# Patient Record
Sex: Male | Born: 1975 | Race: Black or African American | Hispanic: No | Marital: Single | Smoking: Never smoker
Health system: Southern US, Community
[De-identification: ages and names within clinical notes are randomized; demographics above are authoritative.]

---

## 2011-06-22 ENCOUNTER — Encounter (HOSPITAL_COMMUNITY): Payer: Self-pay | Admitting: Nurse Practitioner

## 2011-06-22 ENCOUNTER — Emergency Department (HOSPITAL_COMMUNITY)
Admission: EM | Admit: 2011-06-22 | Discharge: 2011-06-22 | Disposition: A | Discharge status: Home / Self Care | Payer: Self-pay | Attending: Emergency Medicine | Admitting: Emergency Medicine

## 2011-06-22 ENCOUNTER — Emergency Department (HOSPITAL_COMMUNITY): Payer: Self-pay

## 2011-06-22 DIAGNOSIS — R011 Cardiac murmur, unspecified: Secondary | ICD-10-CM | POA: Insufficient documentation

## 2011-06-22 DIAGNOSIS — R599 Enlarged lymph nodes, unspecified: Secondary | ICD-10-CM | POA: Insufficient documentation

## 2011-06-22 DIAGNOSIS — J36 Peritonsillar abscess: Secondary | ICD-10-CM | POA: Insufficient documentation

## 2011-06-22 LAB — DIFFERENTIAL
Basophils Absolute: 0.2 10*3/uL — ABNORMAL HIGH (ref 0.0–0.1)
Lymphocytes Relative: 11 % — ABNORMAL LOW (ref 12–46)
Monocytes Relative: 7 % (ref 3–12)
Neutro Abs: 13.6 10*3/uL — ABNORMAL HIGH (ref 1.7–7.7)
Neutrophils Relative %: 81 % — ABNORMAL HIGH (ref 43–77)

## 2011-06-22 LAB — BASIC METABOLIC PANEL
BUN: 16 mg/dL (ref 6–23)
Chloride: 99 mEq/L (ref 96–112)
GFR calc Af Amer: >90 mL/min (ref >90)
Potassium: 4.3 mEq/L (ref 3.5–5.1)
Sodium: 138 mEq/L (ref 135–145)

## 2011-06-22 LAB — CBC
HCT: 42 % (ref 39.0–52.0)
RDW: 12.3 % (ref 11.5–15.5)
WBC: 16.8 10*3/uL — ABNORMAL HIGH (ref 4.0–10.5)

## 2011-06-22 MED ORDER — CHLORHEXIDINE GLUCONATE 0.12 % MT SOLN: 15.0000 mL | Freq: Two times a day (BID) | OROMUCOSAL | Status: AC

## 2011-06-22 MED ORDER — MORPHINE SULFATE 4 MG/ML IJ SOLN
4.0000 mg | Freq: Once | INTRAMUSCULAR | Status: AC
  Administered 2011-06-22: 4 mg via INTRAVENOUS
  Filled 2011-06-22: qty 1

## 2011-06-22 MED ORDER — OXYCODONE-ACETAMINOPHEN 5-325 MG PO TABS: 1.0000 | ORAL_TABLET | Freq: Four times a day (QID) | ORAL | Status: AC | PRN

## 2011-06-22 MED ORDER — SODIUM CHLORIDE 0.9 % IV SOLN
3.0000 g | Freq: Once | INTRAVENOUS | Status: AC
  Administered 2011-06-22: 3 g via INTRAVENOUS
  Filled 2011-06-22: qty 3

## 2011-06-22 MED ORDER — MORPHINE SULFATE 4 MG/ML IJ SOLN
6.0000 mg | Freq: Once | INTRAMUSCULAR | Status: AC
  Administered 2011-06-22: 6 mg via INTRAVENOUS
  Filled 2011-06-22: qty 2

## 2011-06-22 MED ORDER — LIDOCAINE VISCOUS 2 % MT SOLN: 20.0000 mL | OROMUCOSAL | Status: AC | PRN

## 2011-06-22 MED ORDER — DEXAMETHASONE SODIUM PHOSPHATE 10 MG/ML IJ SOLN
10.0000 mg | Freq: Once | INTRAMUSCULAR | Status: AC
  Administered 2011-06-22: 10 mg via INTRAVENOUS
  Filled 2011-06-22: qty 1

## 2011-06-22 MED ORDER — AMOXICILLIN-POT CLAVULANATE 875-125 MG PO TABS: 1.0000 | ORAL_TABLET | Freq: Two times a day (BID) | ORAL | Status: AC

## 2011-06-22 MED ORDER — SODIUM CHLORIDE 0.9 % IV SOLN
Freq: Once | INTRAVENOUS | Status: AC
  Administered 2011-06-22: 15:00:00 via INTRAVENOUS

## 2011-06-22 MED ORDER — PREDNISONE 20 MG PO TABS: 40.0000 mg | ORAL_TABLET | Freq: Every day | ORAL | Status: AC

## 2011-06-22 MED ORDER — IOHEXOL 300 MG/ML  SOLN
80.0000 mL | Freq: Once | INTRAMUSCULAR | Status: AC | PRN
  Administered 2011-06-22: 80 mL via INTRAVENOUS

## 2011-06-22 NOTE — ED Provider Notes (Signed)
History     CSN: [id number]  Arrival date & time 06/22/11  1201   First MD Initiated Contact with Patient 06/22/11 1407      Chief Complaint  Patient presents with  . Lymphadenopathy    (Consider location/radiation/quality/duration/timing/severity/associated sxs/prior treatment) The history is provided by the patient.  35yo otherwise healthy patient presents with 5 days of sore throat, cough, congestion and "gland swelling" to the left side of his neck. He states that the swelling has progressed over the past several days. He is able to drink but states it has become gradually more difficult to do so. States he often finds himself having to spit out his saliva as he is unable to swallow. He has been unable to eat for several days. Denies difficulty breathing. Denies fever, chills, nausea, vomiting, diarrhea. He has had no chest pain, shortness of breath, abdominal pain. No recent weight loss. Denies recent dental problems.  History reviewed. No pertinent past medical history.  History reviewed. No pertinent past surgical history.  History reviewed. No pertinent family history.  History  Substance Use Topics  . Smoking status: Never Smoker   . Smokeless tobacco: Not on file  . Alcohol Use: No      Review of Systems  Constitutional: Positive for appetite change. Negative for fever, chills, diaphoresis, activity change and unexpected weight change.  HENT: Positive for congestion, sore throat, facial swelling, rhinorrhea, trouble swallowing and neck pain. Negative for ear pain, drooling, neck stiffness, dental problem, sinus pressure and tinnitus.   Eyes: Negative.   Respiratory: Positive for cough. Negative for chest tightness, shortness of breath and wheezing.   Cardiovascular: Negative for chest pain and palpitations.  Gastrointestinal: Negative for nausea, vomiting, abdominal pain and diarrhea.  Skin: Negative for color change and rash.  Neurological: Negative for headaches.      Allergies  Review of patient's allergies indicates no known allergies.  Home Medications   Current Outpatient Rx  Name Route Sig Dispense Refill  . PHENOL 1.4 % MT LIQD Mouth/Throat Use as directed 1 spray in the mouth or throat as needed. For swollen gland.      BP 138/89  Pulse 87  Temp(Src) 98.5 F (36.9 C) (Oral)  Resp 18  Ht 6' (1.829 m)  Wt 170 lb (77.111 kg)  BMI 23.06 kg/m2  SpO2 99%  Physical Exam  Nursing note and vitals reviewed. Constitutional: He appears well-developed and well-nourished. No distress.  HENT:  Head: Normocephalic and atraumatic. There is trismus in the jaw.  Right Ear: Tympanic membrane and external ear normal.  Left Ear: Tympanic membrane and external ear normal.       Trismus; pt only able to open mouth about 2-3 cm. Unable to fully visualize posterior oropharynx. Mucus membranes moist.  Eyes: EOM are normal. Pupils are equal, round, and reactive to light.  Neck: Trachea normal. Neck supple. No spinous process tenderness and no muscular tenderness present. No rigidity. No tracheal deviation present. No Brudzinski's sign and no Kernig's sign noted. No thyromegaly present.       Moderate submandibular swelling L. Tenderness. No overlying erythema.  Cardiovascular: Normal rate, regular rhythm and normal heart sounds.   Pulmonary/Chest: Effort normal and breath sounds normal. No stridor. He has no wheezes. He exhibits no tenderness.  Abdominal: Soft. There is no tenderness.  Neurological: He is alert. No cranial nerve deficit.  Skin: Skin is warm and dry. He is not diaphoretic.  Psychiatric: He has a normal mood and affect.  ED Course  Procedures (including critical care time)   Labs Reviewed  CBC  DIFFERENTIAL  BASIC METABOLIC PANEL   No results found.   No diagnosis found.    MDM  2:00 PM Patient seen and assessed. He has had sore throat, cough and congestion for the past 5 days which has been accompanied by gradual  submandibular left-sided swelling. On exam, he is unable to open his mouth more than about 2 cm. There is swelling and tenderness noted to the left submandibular area, but this does not appear extensive. Cannot fully visualize post oropharynx. Plan to obtain CT of the soft tissue of the neck to rule out abscess. Will obtain basic labs and give Decadron. Discussed with Dr. Jeraldine Loots.  3:22 PM Signout given to Jay, PA-C who will disposition as appropriate. CT, labs pending.    Jacub Waiters, [place] 06/22/11 1537

## 2011-06-22 NOTE — ED Notes (Signed)
Patient transported to CT 

## 2011-06-22 NOTE — ED Notes (Signed)
C./o "glands swelling" to L side of face over past several days. States hard to eat/drink/swallow due to swelling but denies trouble breathing

## 2011-06-22 NOTE — Discharge Instructions (Signed)
Please read and follow all provided instructions.  Your diagnoses today include:  1. Tonsillar abscess     Tests performed today include:  Blood counts  CT scan of your neck that shows an abscess of your tonsil  Vital signs. See below for your results today.   Medications prescribed:   Percocet (oxycodone/acetaminophen) - narcotic pain medication  You have been prescribed narcotic pain medication such as Vicodin or Percocet: DO NOT drive or perform any activities that require you to be awake and alert because this medicine can make you drowsy. Do not take any other medications that contain tylenol while taking this medication because you might take too much.    Augmentin - antibiotic  You have been prescribed an antibiotic medicine: take the entire course of medicine even if you are feeling better. Stopping early can cause the antibiotic not to work.   Peridex - to rinse mouth  Viscous lidocaine - to numb mouth  Take any prescribed medications only as directed.  Home care instructions:  Follow any educational materials contained in this packet.  Follow-up instructions: Please follow-up with the ear, nose, and throat doctor in the next 2 days for further evaluation of your symptoms. Call and tell them you were in the Emergency Department and diagnosed with a tonsillar abscess.    If you do not have a primary care doctor -- see below for referral information.   Return instructions:   Please return to the Emergency Department if you experience worsening symptoms, worsening swelling, persistent fever, or have trouble breathing.   Please return if you have any other emergent concerns.  Additional Information:  Your vital signs today were: BP 138/81  Pulse 82  Temp(Src) 98.5 F (36.9 C) (Oral)  Resp 16  Ht 6' (1.829 m)  Wt 170 lb (77.111 kg)  BMI 23.06 kg/m2  SpO2 97% If your blood pressure (BP) was elevated above 135/85 this visit, please have this repeated by  your doctor within one month. -------------- No Primary Care Doctor Call [facility]  [phone] Other agencies that provide inexpensive medical care    [facility]  [phone]    [facility]  [phone]    [facility]  [phone]    [facility]  [phone]    [facility]  [phone]    [facility]  [phone] -------------- RESOURCE GUIDE:  Dental Problems  Patients with Medicaid: [facility] Dental [phone]                                            [phone] [address] [employer] [address] Phone:  [phone]                                                   Phone:  [phone]  If unable to pay or uninsured, contact:  [facility] or [facility]. to become qualified for the adult dental clinic.  Chronic Pain Problems Contact [facility]  [phone] Patients need to be referred by their primary care doctor.  Insufficient Money for Medicine  Contact [employer]:  call "211" or [facility] [employer] [phone].  Psychological [facility]  [phone] [facility]  [phone] [facility]   [phone] (emergency services [phone])  Substance Abuse Resources [facility]  [phone] [facility] [phone] [facility] [phone] Floydene Flock [phone] Residential & Outpatient Substance Abuse Program  [phone]  Abuse/Neglect [facility] Child Abuse Hotline [phone] [facility] Child Abuse Hotline [phone] (After Hours)  Emergency Shelter [facility] [phone]  Maternity Homes Room at the [facility] [phone] [facility] [phone]  [facility] Resources  [facility]     [employer]                          [facility] [address] [place]                        [address]      [address]  Blondell Reveal Phone:  [phone]                                   Phone:  [phone]                 Phone:  [phone]  [facility] Phone:  [phone]  [facility] Child Abuse Hotline [phone] [phone] (After Hours)

## 2011-06-22 NOTE — ED Provider Notes (Signed)
Medical screening examination/treatment/procedure(s) were conducted as a shared visit with non-physician practitioner(s) and myself.  I personally evaluated the patient during the encounter On my exam the patient is in no distress, though he has a notable left peritonsillar enhancement. I reviewed the CT scan.  I performed an aspiration attempt with moderate success.  Following the aspiration, which was tolerated well, the patient's lesion was less pronounced, and there was a visible screening uvula in the left side of the posterior oropharynx, which was not present prior to the aspiration.  The patient was discharged with antibiotics, ENT followup in 2 days.  PROCEDURE NOTE Patient prepped in usual fashion Verbal consent provided following discussion of R/B and alternatives Patient prepped with topical spray, then 3ml of 2%lido injected into the lesion (after visualization of ct for localization) Three probes with a (depth limited) 18g needle were performed with withdrawal of serosanguinous fluid and decrease in size of abscess.  Procedure was well tolerated.  Gerhard Munch, MD 06/22/11 1946

## 2011-06-22 NOTE — ED Provider Notes (Signed)
History     CSN: [id number]  Arrival date & time 06/22/11  1201   First MD Initiated Contact with Patient 06/22/11 1407      Chief Complaint  Patient presents with  . Lymphadenopathy    (Consider location/radiation/quality/duration/timing/severity/associated sxs/prior treatment) HPI  History reviewed. No pertinent past medical history.  History reviewed. No pertinent past surgical history.  History reviewed. No pertinent family history.  History  Substance Use Topics  . Smoking status: Never Smoker   . Smokeless tobacco: Not on file  . Alcohol Use: No      Review of Systems  Allergies  Review of patient's allergies indicates no known allergies.  Home Medications   Current Outpatient Rx  Name Route Sig Dispense Refill  . PHENOL 1.4 % MT LIQD Mouth/Throat Use as directed 1 spray in the mouth or throat as needed. For swollen gland.      BP 138/89  Pulse 87  Temp(Src) 98.5 F (36.9 C) (Oral)  Resp 18  Ht 6' (1.829 m)  Wt 170 lb (77.111 kg)  BMI 23.06 kg/m2  SpO2 99%  Physical Exam  ED Course  Procedures (including critical care time)  Labs Reviewed  CBC - Abnormal; Notable for the following:    WBC 16.8 (*)    MCV 77.6 (*)    MCHC 36.2 (*)    All other components within normal limits  DIFFERENTIAL - Abnormal; Notable for the following:    Neutrophils Relative 81 (*)    Lymphocytes Relative 11 (*)    Neutro Abs 13.6 (*)    Monocytes Absolute 1.2 (*)    Basophils Absolute 0.2 (*)    All other components within normal limits  BASIC METABOLIC PANEL - Abnormal; Notable for the following:    GFR calc non Af Amer 87 (*)    All other components within normal limits   Ct Soft Tissue Neck W Contrast  06/22/2011  *RADIOLOGY REPORT*  Clinical Data: Left-sided facial pain and swelling.  CT NECK WITH CONTRAST  Technique:  Multidetector CT imaging of the neck was performed with intravenous contrast.  Contrast: 80mL OMNIPAQUE IOHEXOL 300 MG/ML IJ SOLN   Comparison: None.  Findings: The visualized portion of the brain appears normal.  The major visualized intravascular structures are patent.  The skull base is unremarkable.  The visualized paranasal sinuses and mastoid air cells are clear except for scattered left ethmoid mucoperiosteal thickening and a mucous retention cyst or polyp in the left maxillary sinus.  There is a large tonsillar abscess on the left side measuring approximately 3 cm.  There is narrowing of the posterior nasopharynx.  The epiglottis and aryepiglottic folds are normal. The piriform sinus and vallecular air spaces are normal.  No prevertebral or retropharyngeal extension.  The parotid and submandibular glands are normal.  There are enlarged submandibular and jugulodigastric lymph nodes bilaterally.  The thyroid gland appears normal.  The major vascular structures are patent.  No supraclavicular or superior mediastinal lymphadenopathy.  The lung apices are clear.  IMPRESSION: 3 cm left tonsillar abscess.  Original Report Authenticated By: P. Loralie Champagne, M.D.     1. Tonsillar abscess     3:35 PM patient to CDU pending CT scan of neck. Patient presents with several days of trismus and sore throat. Denies fever. Patient has been given pain medication IV in emergency department.  Patient seen and examined. He notes that his throat swelling is improved after receiving steroids. He says he is talking more easily now.  On my exam the patient has a large left-sided peritonsillar abscess. Antibiotics ordered. CT is pending. Suspect patient will need ENT consult for drainage.  3:37 PM Exam: Gen NAD; Mouth fullness to the left posterior oropharynx with uvula deviated to the right; Heart RRR, nml S1,S2, no m/r/g; Lungs CTAB, no respiratory distress; Abd soft, NT, no rebound or guarding; Ext 2+ pedal pulses bilaterally, no edema.  5:41 PM CT reviewed by myself and Dr. Jeraldine Loots. Drainage of peritonsillar abscess attempted by Dr. Jeraldine Loots  with return of small amount of pus. Will discharge home on antibiotics, pain medicine. Patient will need to see ENT on Monday for followup. Referral given.  Patient counseled to return with worsening symptoms, trouble breathing, worsening throat swelling, persistent fever, or other concerns.  MDM  Tonsillar abscess. Drainage attempted in the ED. Antibiotics given by IV in ED. ENT referral given. Patient is not having any airway compromise. He appears well and is stable at time of discharge home.       Renne Crigler, [place] 06/22/11 1952

## 2011-06-23 NOTE — ED Provider Notes (Signed)
Medical screening examination/treatment/procedure(s) were performed by non-physician practitioner and as supervising physician I was immediately available for consultation/collaboration.  Gerhard Munch, MD 06/23/11 [phone]

## 2013-07-19 IMAGING — CT CT NECK W/ CM
3 of 5 series · 8 of 20 positions shown, 9 images · IV contrast (75ml omni 300)
Comparison: None.

CLINICAL DATA: Left-sided facial pain and swelling.

CT NECK WITH CONTRAST
TECHNIQUE: Multidetector CT imaging of the neck was performed with
intravenous contrast.
Contrast: 80mL OMNIPAQUE IOHEXOL 300 MG/ML IJ SOLN

[Series 2: 2cc/30ml and 1cc/45ml · axial · 0.47mm/px · z∈[-230,-106]mm · 3 of 100 slices shown]
[im 25/100  bone]
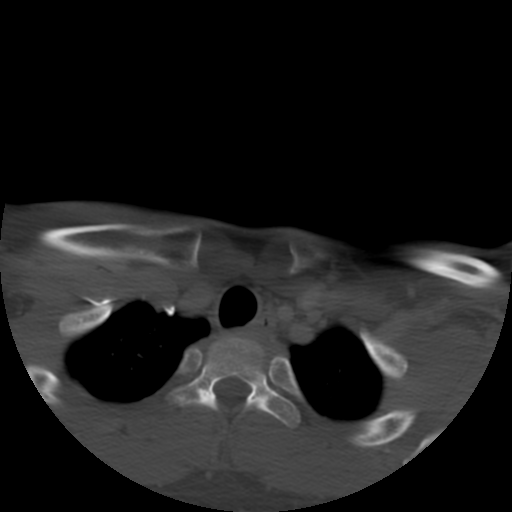
[im 50/100  bone]
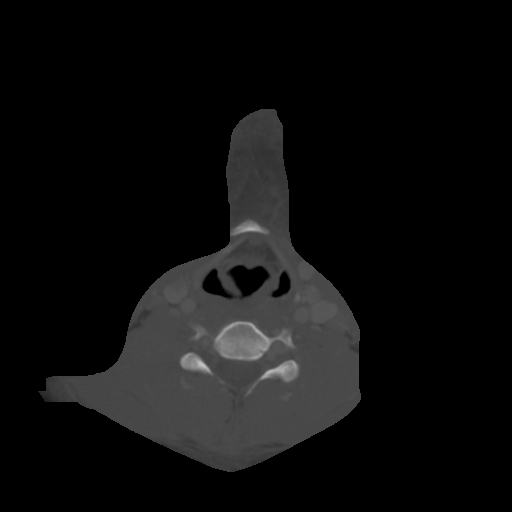
[im 75/100  bone]
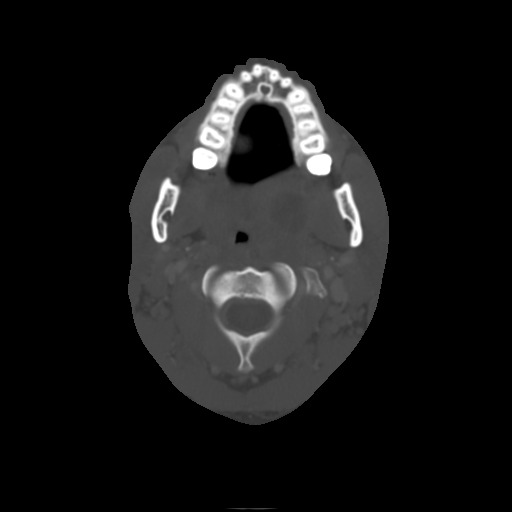

[Series 300: orthogonals · axial · 0.39mm/px · z∈[-233,-156]mm · 2 of 83 slices shown, 3 images]
[im 28/83  soft-tissue]
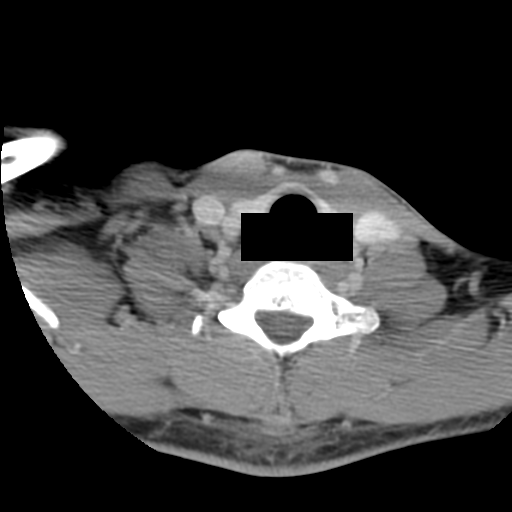
[im 28/83  bone]
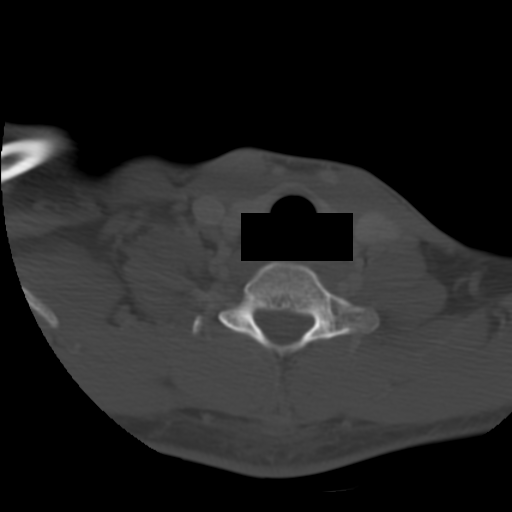
[im 55/83  bone]
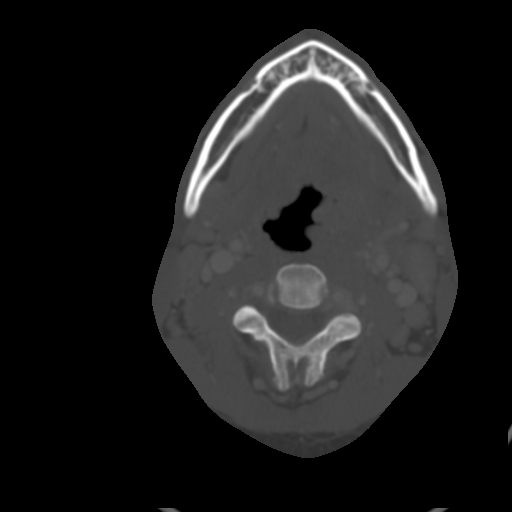

[Series 301: cor · coronal · 0.39mm/px · 3 of 68 slices shown]
[im 14/68  bone]
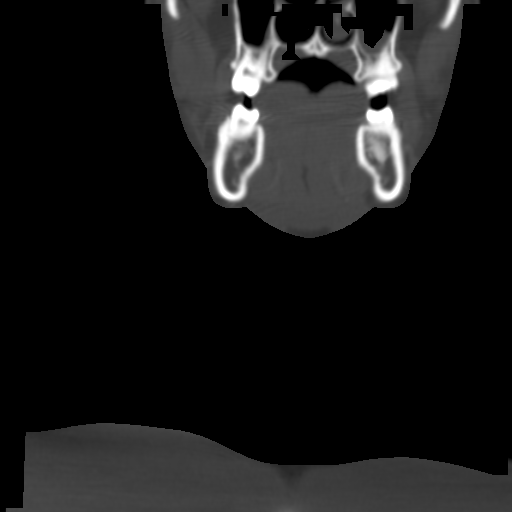
[im 27/68  bone]
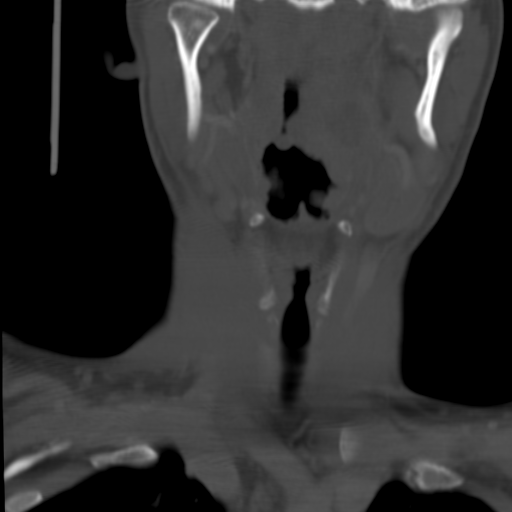
[im 41/68  bone]
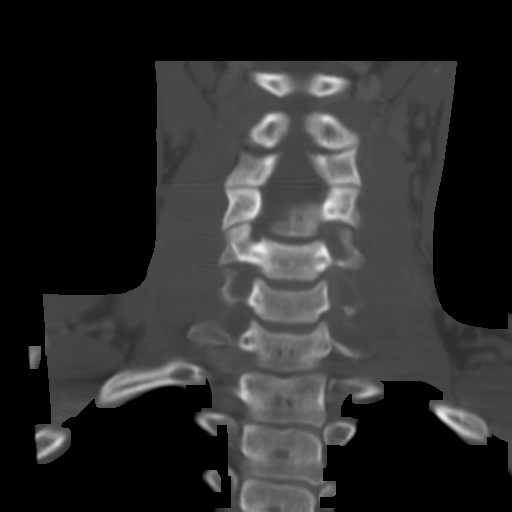

[8 of 20 positions shown; findings below may reference images not displayed]

FINDINGS: The visualized portion of the brain appears normal.  The
major visualized intravascular structures are patent.  The skull
base is unremarkable.  The visualized paranasal sinuses and mastoid
air cells are clear except for scattered left ethmoid
mucoperiosteal thickening and a mucous retention cyst or polyp in
the left maxillary sinus.

There is a large tonsillar abscess on the left side measuring
approximately 3 cm.  There is narrowing of the posterior
nasopharynx.  The epiglottis and aryepiglottic folds are normal.
The piriform sinus and vallecular air spaces are normal.  No
prevertebral or retropharyngeal extension.

The parotid and submandibular glands are normal.  There are
enlarged submandibular and jugulodigastric lymph nodes bilaterally.

The thyroid gland appears normal.  The major vascular structures
are patent.  No supraclavicular or superior mediastinal
lymphadenopathy.  The lung apices are clear.
IMPRESSION: 3 cm left tonsillar abscess.

## 2019-03-31 ENCOUNTER — Emergency Department (HOSPITAL_COMMUNITY)
Admission: EM | Admit: 2019-03-31 | Discharge: 2019-03-31 | Disposition: A | Discharge status: Home / Self Care | Payer: BC Managed Care – PPO | Attending: Emergency Medicine | Admitting: Emergency Medicine

## 2019-03-31 ENCOUNTER — Other Ambulatory Visit: Payer: Self-pay

## 2019-03-31 ENCOUNTER — Encounter (HOSPITAL_COMMUNITY): Payer: Self-pay | Admitting: Emergency Medicine

## 2019-03-31 DIAGNOSIS — U071 COVID-19: Secondary | ICD-10-CM | POA: Insufficient documentation

## 2019-03-31 DIAGNOSIS — R55 Syncope and collapse: Secondary | ICD-10-CM

## 2019-03-31 LAB — CBC
HCT: 40.9 % (ref 39.0–52.0)
Hemoglobin: 13.1 g/dL (ref 13.0–17.0)
MCH: 26.5 pg (ref 26.0–34.0)
MCHC: 32 g/dL (ref 30.0–36.0)
MCV: 82.8 fL (ref 80.0–100.0)
Platelets: 166 10*3/uL (ref 150–400)
RBC: 4.94 MIL/uL (ref 4.22–5.81)
RDW: 12.7 % (ref 11.5–15.5)
WBC: 5.2 10*3/uL (ref 4.0–10.5)
nRBC: 0 % (ref 0.0–0.2)

## 2019-03-31 LAB — URINALYSIS, ROUTINE W REFLEX MICROSCOPIC
Bilirubin Urine: NEGATIVE
Glucose, UA: NEGATIVE mg/dL
Hgb urine dipstick: NEGATIVE
Ketones, ur: NEGATIVE mg/dL
Leukocytes,Ua: NEGATIVE
Nitrite: NEGATIVE
Protein, ur: NEGATIVE mg/dL
Specific Gravity, Urine: 1.011 (ref 1.005–1.030)
pH: 7 (ref 5.0–8.0)

## 2019-03-31 LAB — HEPATIC FUNCTION PANEL
ALT: 31 U/L (ref 0–44)
AST: 31 U/L (ref 15–41)
Albumin: 3.2 g/dL — ABNORMAL LOW (ref 3.5–5.0)
Alkaline Phosphatase: 64 U/L (ref 38–126)
Bilirubin, Direct: <0.1 mg/dL (ref 0.0–0.2)
Total Bilirubin: 0.4 mg/dL (ref 0.3–1.2)
Total Protein: 6.8 g/dL (ref 6.5–8.1)

## 2019-03-31 LAB — BASIC METABOLIC PANEL
Anion gap: 10 (ref 5–15)
BUN: 6 mg/dL (ref 6–20)
CO2: 26 mmol/L (ref 22–32)
Calcium: 8.7 mg/dL — ABNORMAL LOW (ref 8.9–10.3)
Chloride: 102 mmol/L (ref 98–111)
Creatinine, Ser: 1 mg/dL (ref 0.61–1.24)
GFR calc Af Amer: >60 mL/min (ref >60)
GFR calc non Af Amer: >60 mL/min (ref >60)
Glucose, Bld: 116 mg/dL — ABNORMAL HIGH (ref 70–99)
Potassium: 3.6 mmol/L (ref 3.5–5.1)
Sodium: 138 mmol/L (ref 135–145)

## 2019-03-31 LAB — CBG MONITORING, ED: Glucose-Capillary: 101 mg/dL — ABNORMAL HIGH (ref 70–99)

## 2019-03-31 LAB — POC SARS CORONAVIRUS 2 AG -  ED: SARS Coronavirus 2 Ag: POSITIVE — AB

## 2019-03-31 MED ORDER — LACTATED RINGERS IV BOLUS
1000.0000 mL | Freq: Once | INTRAVENOUS | Status: AC
  Administered 2019-03-31: 11:00:00 1000 mL via INTRAVENOUS

## 2019-03-31 MED ORDER — SODIUM CHLORIDE 0.9% FLUSH
3.0000 mL | Freq: Once | INTRAVENOUS | Status: AC
  Administered 2019-03-31: 3 mL via INTRAVENOUS

## 2019-03-31 NOTE — ED Triage Notes (Signed)
Per EMS, While at work, he began to get dizzy, sat down and then LOC for 30 seconds.  Second episode in a week.  Only other complaint is fever that he has been treating w/ OTC.  No other covid symptoms.   124/83 HR 74 RR 16 100% RA 132 CBG  97.7 temp

## 2019-03-31 NOTE — ED Provider Notes (Signed)
[facility] EMERGENCY DEPARTMENT Provider Note   CSN: [id number] Arrival date & time: 03/31/19  8299     History Chief Complaint  Patient presents with  . Loss of Consciousness    Kyle Melton is a 43 y.o. male.  43 year old male who presents with syncope.  Patient states that he has had 2 syncopal episodes, one 3 days ago and one at work this morning.  3 days ago, he was in his bathroom, had just use the bathroom and stood up off of the toilet, feeling fine.  While he was washing his hands, he began feeling lightheaded and sweaty and then briefly passed out.  He woke up immediately and denies any major injuries.  This morning, he was at work sitting down when he began feeling lightheaded and sweaty again like he was going to pass out.  He had a brief loss of consciousness.  He denies any chest pain, shortness of breath, or palpitations before or after the events.  Currently he feels fine.  He notes that he didn't eat anything this morning but ate normally yesterday and denies any decreased appetite.  Of note, he states that he has had subjective fevers this week which she describes as sweatiness, for which she has been taking over-the-counter medications.  He denies any associated symptoms including no fever, sore throat, cough, vomiting, diarrhea, or urinary symptoms. No FH heart disease.  No recent travel, leg swelling/pain, or h/o blood clots.  The history is provided by the patient.  Loss of Consciousness      History reviewed. No pertinent past medical history.  There are no problems to display for this patient.   History reviewed. No pertinent surgical history.     No family history on file.  Social History   Tobacco Use  . Smoking status: Never Smoker  Substance Use Topics  . Alcohol use: No  . Drug use: No    Home Medications Prior to Admission medications   Medication Sig Start Date End Date Taking? Authorizing Provider  phenol  (CHLORASEPTIC) 1.4 % LIQD Use as directed 1 spray in the mouth or throat as needed. For swollen gland.    [provider]    Allergies    Patient has no known allergies.  Review of Systems   Review of Systems  Cardiovascular: Positive for syncope.   All other systems reviewed and are negative except that which was mentioned in HPI  Physical Exam Updated Vital Signs BP 103/62   Pulse 65   Temp 98.5 F (36.9 C) (Oral)   Resp 16   Ht 6' (1.829 m)   Wt 79.4 kg   SpO2 100%   BMI 23.73 kg/m   Physical Exam Vitals and nursing note reviewed.  Constitutional:      General: He is not in acute distress.    Appearance: He is well-developed.  HENT:     Head: Normocephalic and atraumatic.  Eyes:     Conjunctiva/sclera: Conjunctivae normal.  Cardiovascular:     Rate and Rhythm: Normal rate and regular rhythm.     Heart sounds: Normal heart sounds. No murmur.  Pulmonary:     Effort: Pulmonary effort is normal.     Breath sounds: Normal breath sounds.  Abdominal:     General: Bowel sounds are normal. There is no distension.     Palpations: Abdomen is soft.     Tenderness: There is no abdominal tenderness.  Musculoskeletal:     Cervical back:  Neck supple.  Skin:    General: Skin is warm and dry.  Neurological:     Mental Status: He is alert and oriented to person, place, and time.     Comments: Fluent speech  Psychiatric:        Judgment: Judgment normal.     ED Results / Procedures / Treatments   Labs (all labs ordered are listed, but only abnormal results are displayed) Labs Reviewed  HEPATIC FUNCTION PANEL - Abnormal; Notable for the following components:      Result Value   Albumin 3.2 (*)    All other components within normal limits  BASIC METABOLIC PANEL - Abnormal; Notable for the following components:   Glucose, Bld 116 (*)    Calcium 8.7 (*)    All other components within normal limits  CBG MONITORING, ED - Abnormal; Notable for the following  components:   Glucose-Capillary 101 (*)    All other components within normal limits  POC SARS CORONAVIRUS 2 AG -  ED - Abnormal; Notable for the following components:   SARS Coronavirus 2 Ag POSITIVE (*)    All other components within normal limits  CBC  URINALYSIS, ROUTINE W REFLEX MICROSCOPIC    EKG EKG Interpretation  Date/Time:  Wednesday March 31 2019 07:15:28 EST Ventricular Rate:  84 PR Interval:  158 QRS Duration: 88 QT Interval:  360 QTC Calculation: 425 R Axis:   22 Text Interpretation: Normal sinus rhythm Low voltage QRS Nonspecific ST abnormality Abnormal ECG No previous ECGs available Confirmed by Theotis Burrow [phone]) on 03/31/2019 8:34:33 AM   Radiology No results found.  Procedures Procedures (including critical care time)  Medications Ordered in ED Medications  sodium chloride flush (NS) 0.9 % injection 3 mL (3 mLs Intravenous Given 03/31/19 1102)  lactated ringers bolus 1,000 mL (0 mLs Intravenous Stopped 03/31/19 1259)    ED Course  I have reviewed the triage vital signs and the nursing notes.  Pertinent labs that were available during my care of the patient were reviewed by me and considered in my medical decision making (see chart for details).    MDM Rules/Calculators/A&P                      Patient was well-appearing on exam with normal vital signs.  He had no complaints.  Denies any chest pain or shortness of breath.  Because of his report of intermittent subjective fevers this week, obtained COVID-19 test which was positive.  He thinks that his symptoms started 5 days ago.  I explained that he will need strict quarantine for 10 days per CDC guidelines and can return to work after that if he is fever free.  Regarding his syncopal episode, basic lab work is unremarkable with no evidence of severe dehydration and normal CBC.  Because patient did not eat this morning and has been running fevers, he may have a component of mild dehydration  therefore gave him fluid bolus.  He has remained asymptomatic in the ED and given prodrome prior to syncopal events and setting of viral illness, I do not feel he needs further cardiac work-up at this time.  However, I explained that he would need cardiology evaluation if this ever happens again outside of the setting of illness.  I have extensively reviewed return precautions with him particularly any breathing problems or chest pain.  He voiced understanding.   Moris Ratchford was evaluated in Emergency Department on 03/31/2019 for the symptoms described  in the history of present illness. He was evaluated in the context of the global COVID-19 pandemic, which necessitated consideration that the patient might be at risk for infection with the SARS-CoV-2 virus that causes COVID-19. Institutional protocols and algorithms that pertain to the evaluation of patients at risk for COVID-19 are in a state of rapid change based on information released by regulatory bodies including the CDC and federal and state organizations. These policies and algorithms were followed during the patient's care in the ED.  Final Clinical Impression(s) / ED Diagnoses Final diagnoses:  COVID-19 virus infection  Syncope and collapse    Rx / DC Orders ED Discharge Orders    None       Zebastian Carico, Ambrose Finlandachel Morgan, MD 03/31/19 1402

## 2019-03-31 NOTE — ED Notes (Signed)
Pt dc'd home w/all belongings, a/o x4, drove self home, no narcotics given in ed

## 2019-04-06 ENCOUNTER — Encounter: Payer: Self-pay | Admitting: Infectious Diseases

## 2019-04-06 NOTE — Progress Notes (Signed)
Chart reviewed re: Covid symptoms and the use of bamlanivimab, a monoclonal antibody infusion for those with mild to moderate Covid symptoms and at a high risk of hospitalization.  Pt is not qualified for infusion as he has no risk factors identified.

## 2019-09-14 DEATH — deceased

## 2020-04-28 ENCOUNTER — Other Ambulatory Visit: Payer: BC Managed Care – PPO
